# Patient Record
Sex: Female | Born: 1960 | Race: White | Hispanic: No | Marital: Married | Smoking: Current every day smoker
Health system: Southern US, Community
[De-identification: ages and names within clinical notes are randomized; demographics above are authoritative.]

## PROBLEM LIST (undated history)

## (undated) DIAGNOSIS — J432 Centrilobular emphysema: Secondary | ICD-10-CM

## (undated) DIAGNOSIS — B182 Chronic viral hepatitis C: Secondary | ICD-10-CM

## (undated) DIAGNOSIS — K219 Gastro-esophageal reflux disease without esophagitis: Secondary | ICD-10-CM

## (undated) DIAGNOSIS — E785 Hyperlipidemia, unspecified: Secondary | ICD-10-CM

## (undated) HISTORY — DX: Gastro-esophageal reflux disease without esophagitis: K21.9

## (undated) HISTORY — DX: Chronic viral hepatitis C: B18.2

## (undated) HISTORY — DX: Centrilobular emphysema: J43.2

## (undated) HISTORY — PX: VAGINAL HYSTERECTOMY: SUR661

## (undated) HISTORY — DX: Hyperlipidemia, unspecified: E78.5

---

## 2009-11-05 ENCOUNTER — Ambulatory Visit: Payer: Self-pay | Admitting: Family Medicine

## 2010-01-17 IMAGING — US ULTRASOUND RIGHT BREAST
1 series · 12 of 12 positions shown · non-contrast
Comparison: none

REASON FOR EXAM: Right pain nipple area
COMMENTS:

PROCEDURE:     US  - US BREAST RIGHT  - November 05, 2009  [DATE]
RESULT:       The patient received targeted ultrasound of the area of pain
in the right nipple and retroareolar region.  Targeted sonographic images
show no solid or cystic mass.

[Series 1: ultrasound right breast · 12 of 12 slices shown]
[im 1/12]
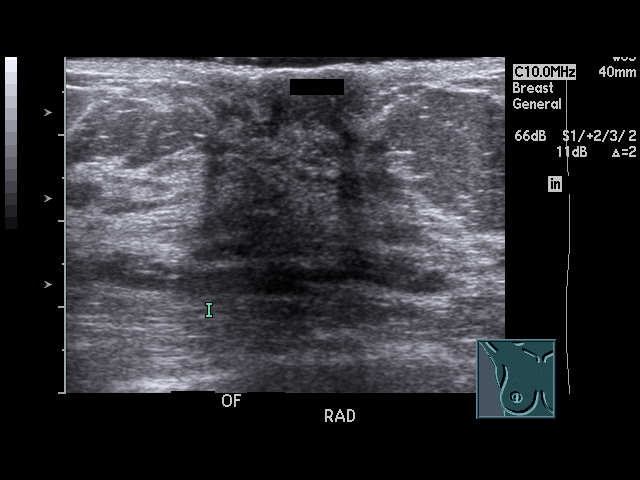
[im 2/12]
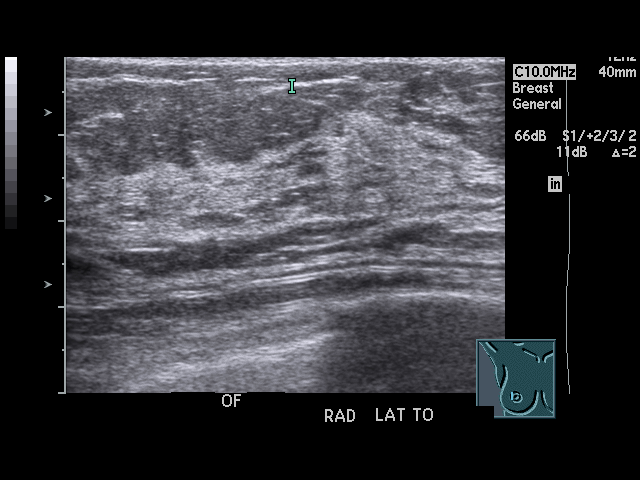
[im 3/12]
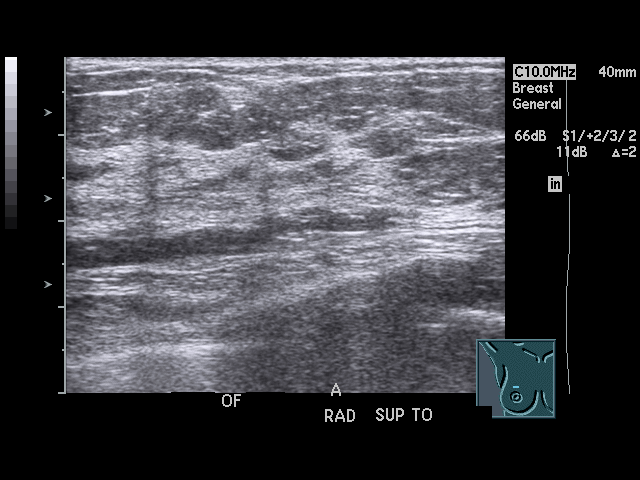
[im 4/12]
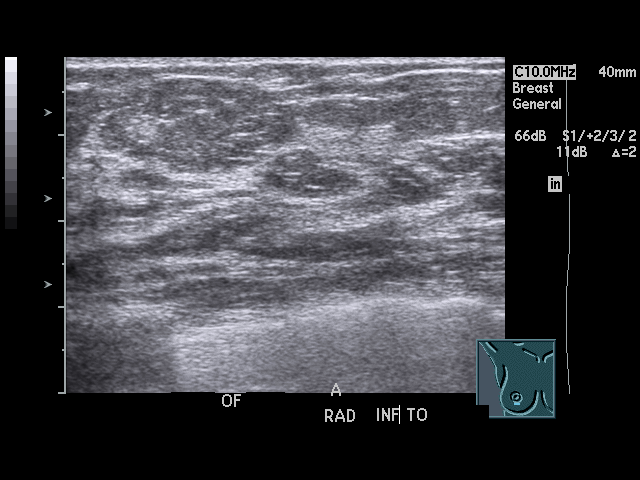
[im 5/12]
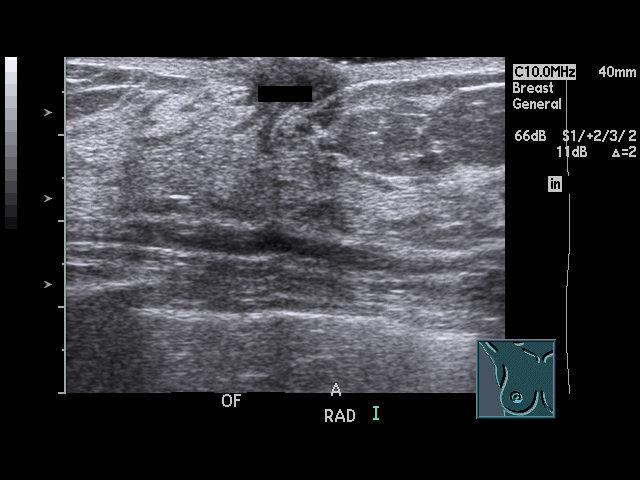
[im 6/12]
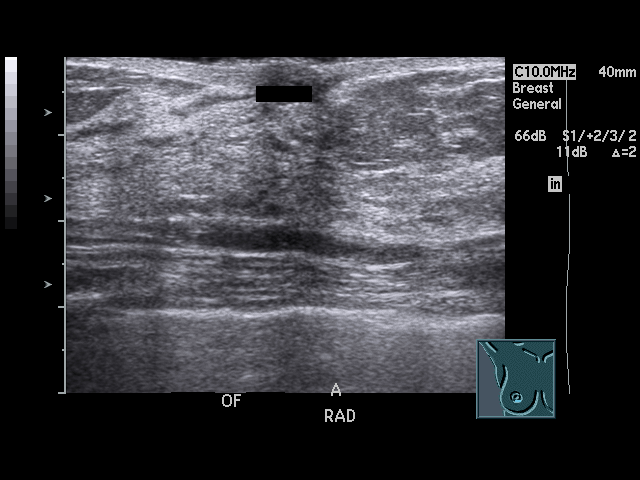
[im 7/12]
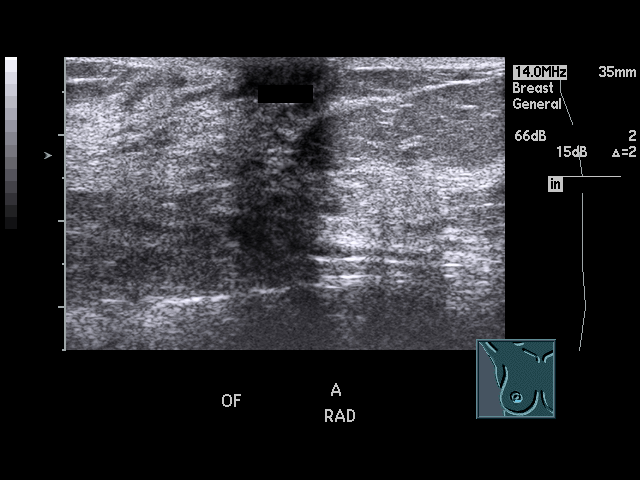
[im 8/12]
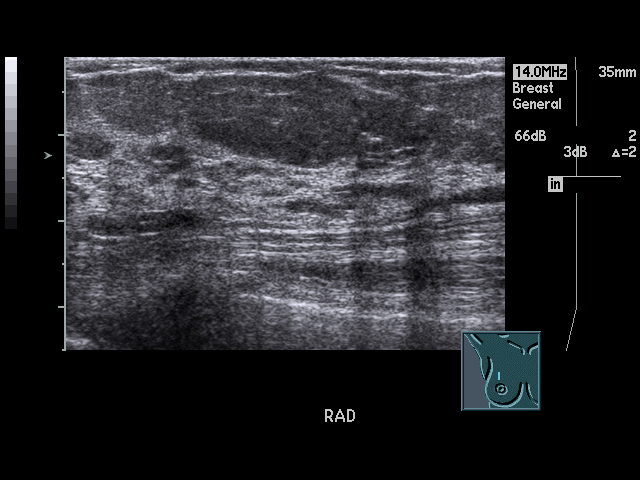
[im 9/12]
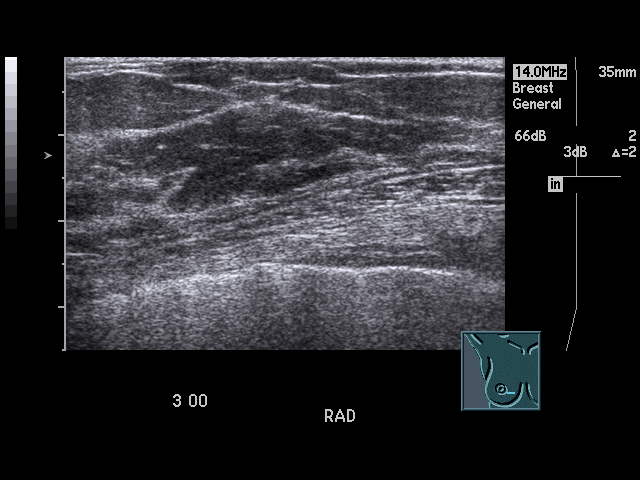
[im 10/12]
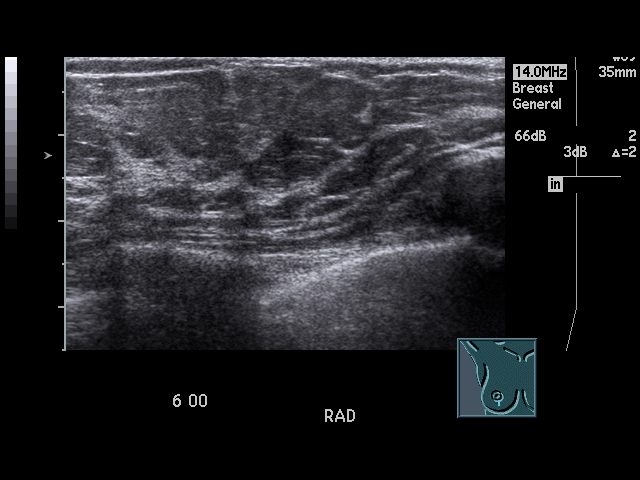
[im 11/12]
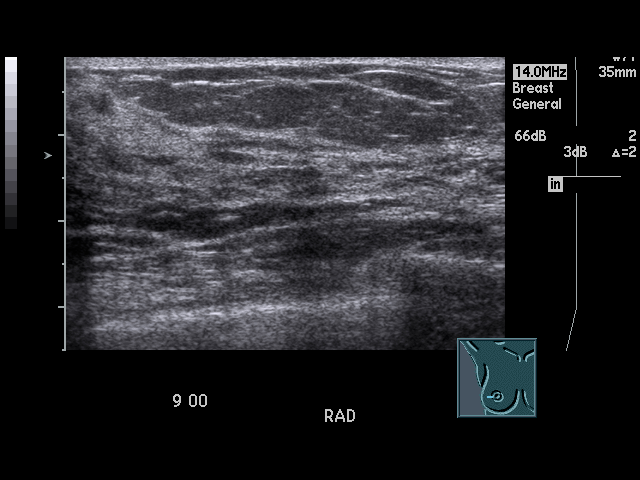
[im 12/12]
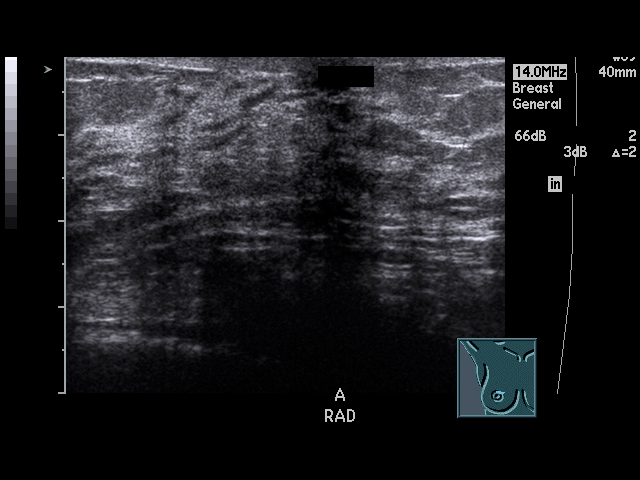

[12 of 12 positions shown; findings below may reference images not displayed]

IMPRESSION: Negative targeted ultrasound of the periareolar right breast region. The
lack of significant mammographic or sonographic findings should not prohibit
further investigation of a clinically worrisome or changing lesion.

## 2010-01-17 IMAGING — MG MAM DGTL DIAGNOSTIC MAMMO W/CAD
1 series · 8 of 8 positions shown · non-contrast
Comparison: none

REASON FOR EXAM: right pain nipple area  us if needed
COMMENTS:

[Series 83: R CC · right · 8 of 8 slices shown]
[im 1/8]
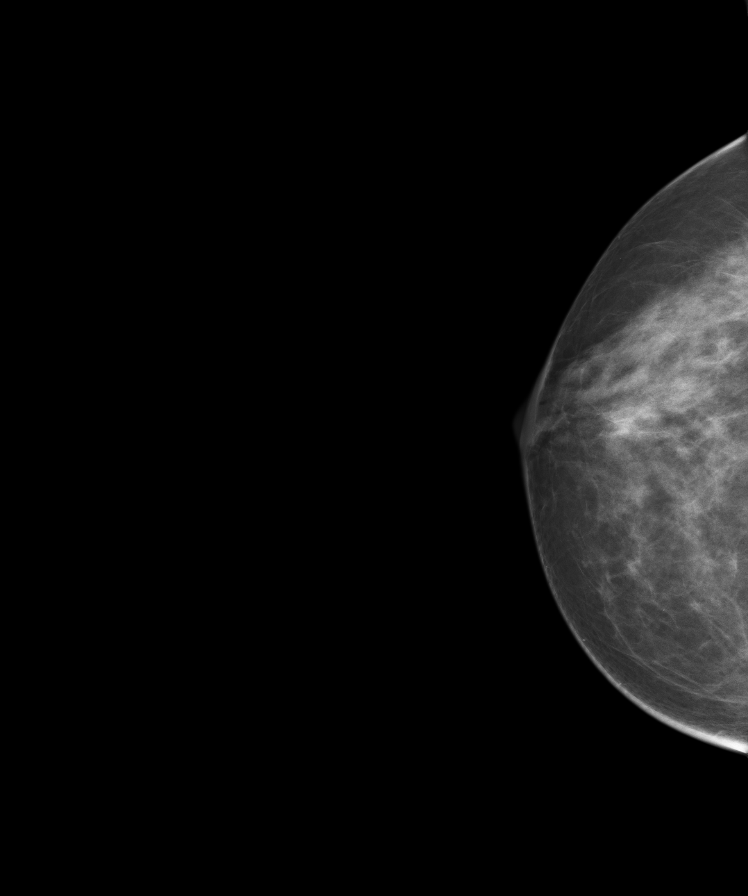
[im 2/8]
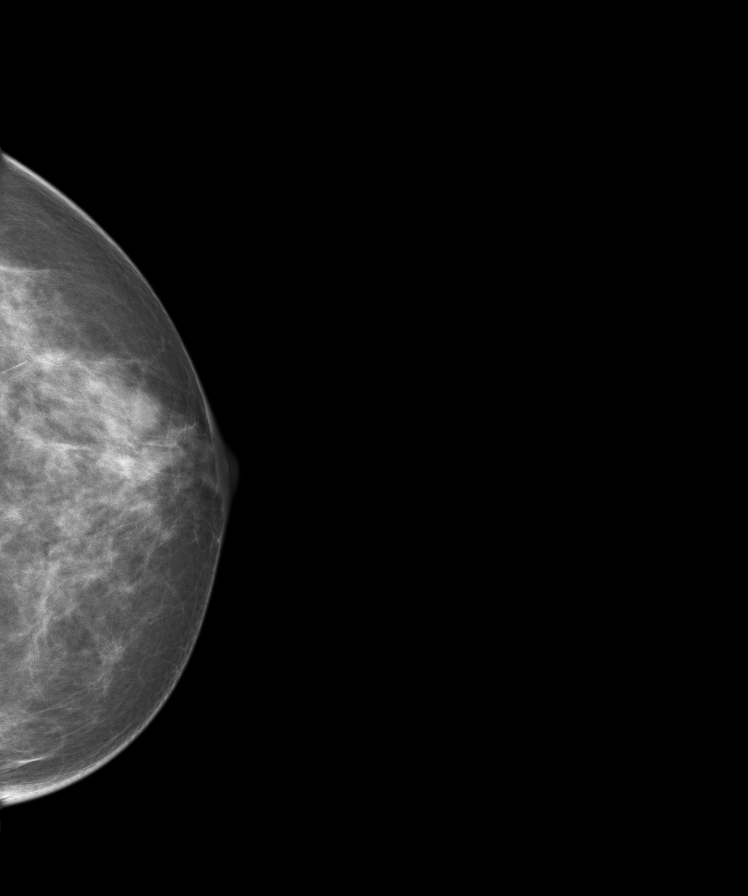
[im 3/8]
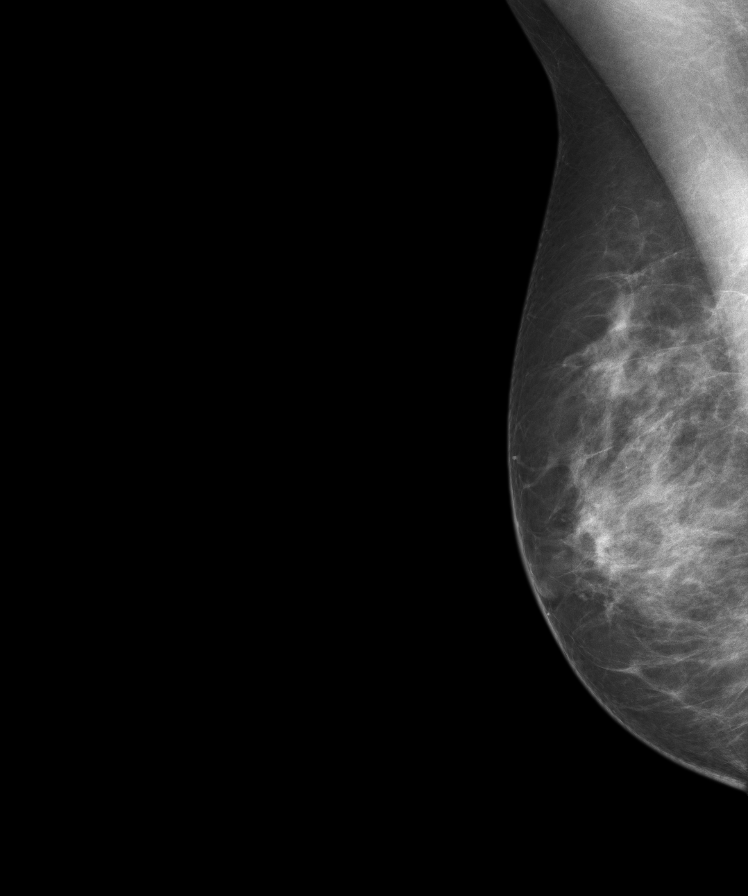
[im 4/8]
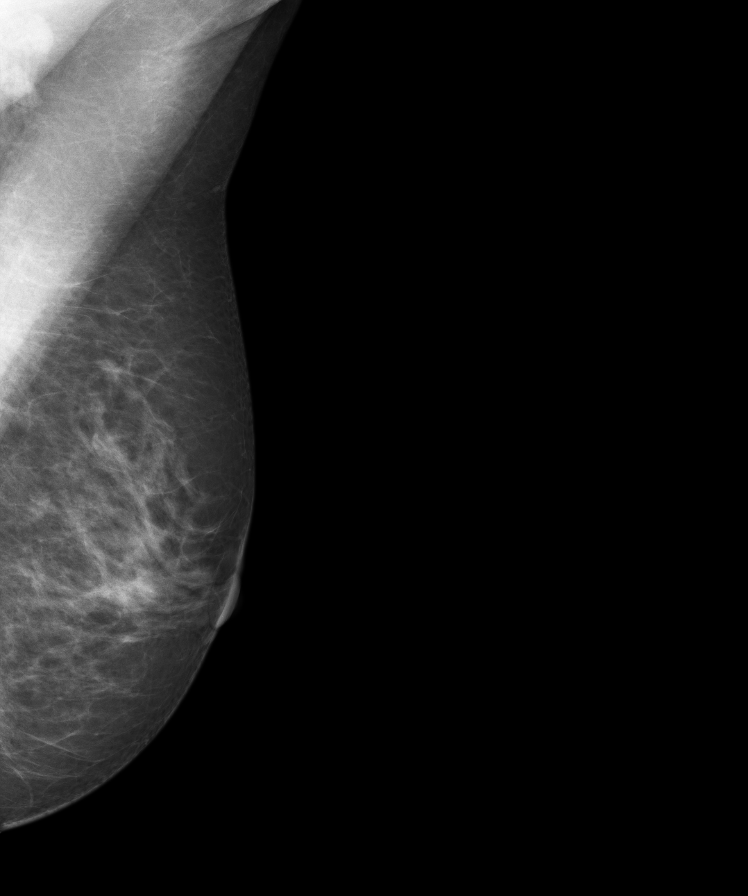
[im 5/8]
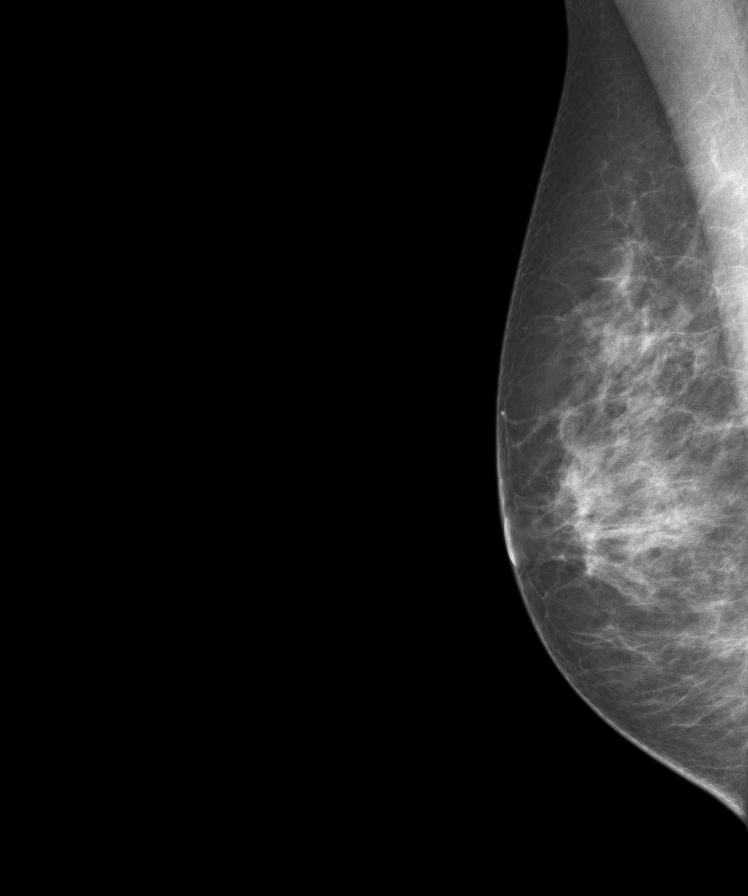
[im 6/8]
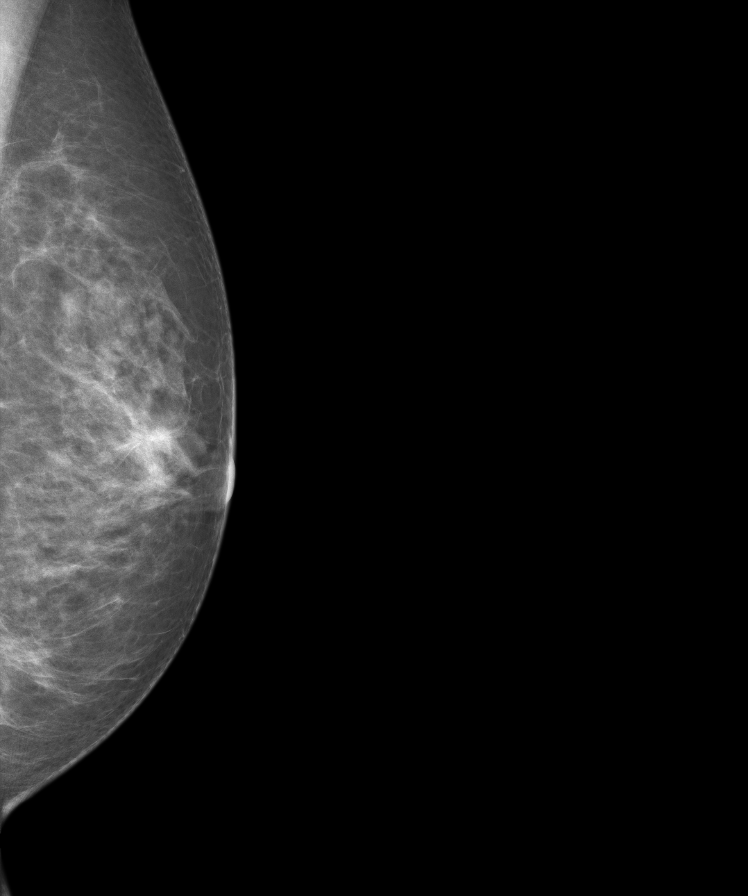
[im 7/8]
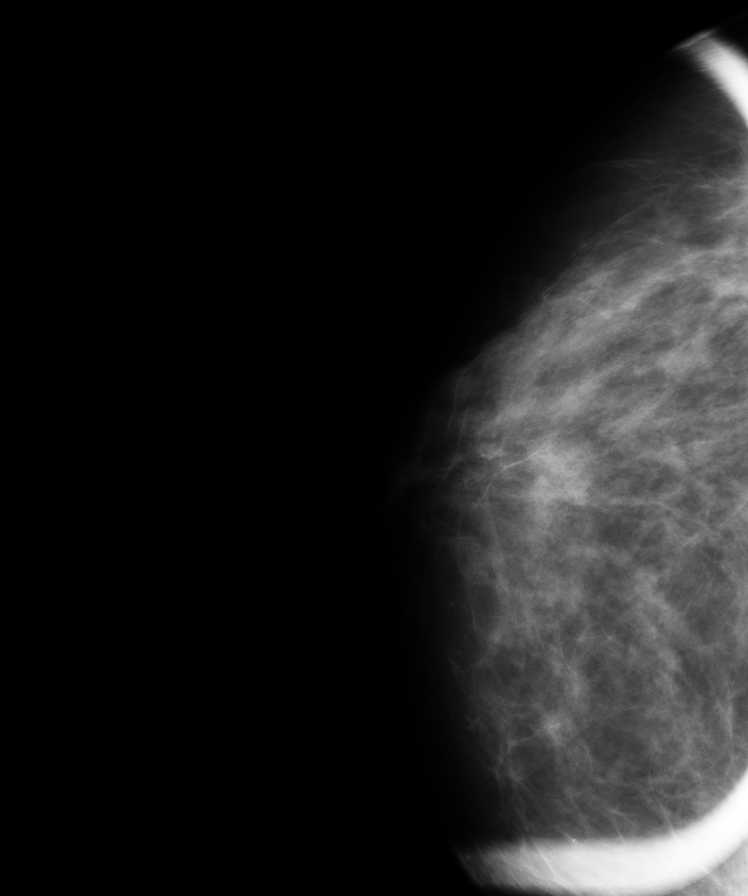
[im 8/8]
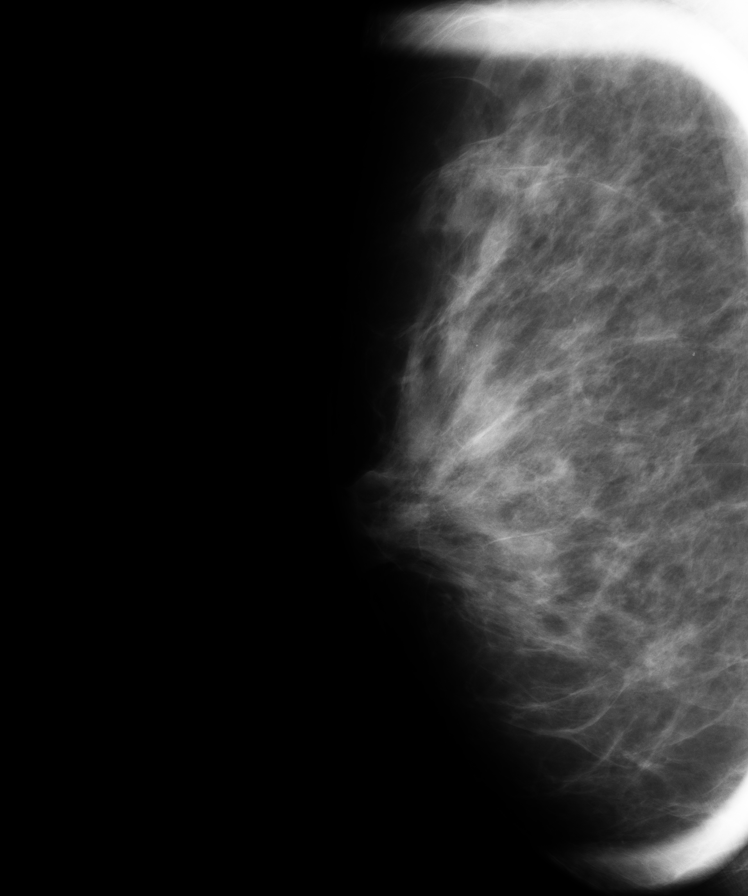

[8 of 8 positions shown; findings below may reference images not displayed]

PROCEDURE:     MAM - MAM DGTL DIAGNOSTIC MAMMO W/CAD  - November 05, 2009 [DATE]

RESULT:       This study is submitted on 11/09/09 for interpretation of
images obtained on 11/05/2009.  Outside images from [REDACTED]
dated 10/29/07 are submitted. Correlation is made to previous digital images
from [HOSPITAL] dated 12/03/02.

The breasts exhibit a moderately dense parenchymal pattern.  There is no
developing density, dominant mass or malignant-appearing calcification. The
appearance is stable. Targeted ultrasound of the periareolar region and
retroareolar region of the right breast was performed and showed no definite
solid or cystic mass.
IMPRESSION: 1.     Stable benign-appearing bilateral mammogram.
2.     BI-RADS:  Category 2- Benign Finding.
3.     Please continue to encourage appropriate annual mammographic follow
up and monthly breast self exam.

A negative mammogram report does not preclude biopsy or other evaluation of
a clinically palpable or otherwise suspicious mass or lesion. Breast cancer
may not be detected by mammography in up to 10% of cases.

## 2015-04-15 DIAGNOSIS — E7800 Pure hypercholesterolemia, unspecified: Secondary | ICD-10-CM | POA: Insufficient documentation

## 2015-04-15 DIAGNOSIS — J432 Centrilobular emphysema: Secondary | ICD-10-CM | POA: Insufficient documentation

## 2015-04-15 DIAGNOSIS — B1920 Unspecified viral hepatitis C without hepatic coma: Secondary | ICD-10-CM | POA: Insufficient documentation

## 2015-07-16 ENCOUNTER — Other Ambulatory Visit: Payer: Self-pay | Admitting: Family Medicine

## 2015-07-16 DIAGNOSIS — B182 Chronic viral hepatitis C: Secondary | ICD-10-CM

## 2015-07-28 ENCOUNTER — Ambulatory Visit
Admission: RE | Admit: 2015-07-28 | Discharge: 2015-07-28 | Disposition: A | Discharge status: Home / Self Care | Payer: No Typology Code available for payment source | Source: Ambulatory Visit | Attending: Family Medicine | Admitting: Family Medicine

## 2015-07-28 ENCOUNTER — Telehealth: Payer: Self-pay | Admitting: Gastroenterology

## 2015-07-28 DIAGNOSIS — I700 Atherosclerosis of aorta: Secondary | ICD-10-CM | POA: Insufficient documentation

## 2015-07-28 DIAGNOSIS — I714 Abdominal aortic aneurysm, without rupture: Secondary | ICD-10-CM | POA: Insufficient documentation

## 2015-07-28 DIAGNOSIS — K824 Cholesterolosis of gallbladder: Secondary | ICD-10-CM | POA: Insufficient documentation

## 2015-07-28 DIAGNOSIS — B182 Chronic viral hepatitis C: Secondary | ICD-10-CM | POA: Diagnosis not present

## 2015-07-28 NOTE — Telephone Encounter (Signed)
Left voice message for patient to call and schedule with Dr. Servando Snare for GERD, chronic constipation, and Hep C

## 2015-08-12 ENCOUNTER — Other Ambulatory Visit: Payer: Self-pay

## 2015-08-24 ENCOUNTER — Ambulatory Visit (INDEPENDENT_AMBULATORY_CARE_PROVIDER_SITE_OTHER): Payer: No Typology Code available for payment source | Admitting: Gastroenterology

## 2015-08-24 ENCOUNTER — Encounter (INDEPENDENT_AMBULATORY_CARE_PROVIDER_SITE_OTHER): Payer: Self-pay

## 2015-08-24 VITALS — BP 115/69 | HR 74 | Temp 98.2°F | Ht 67.0 in | Wt 137.0 lb

## 2015-08-24 DIAGNOSIS — B1920 Unspecified viral hepatitis C without hepatic coma: Secondary | ICD-10-CM

## 2015-08-24 NOTE — Progress Notes (Signed)
Gastroenterology Consultation  Referring Provider:     Marina Goodell, MD Primary Care Physician:  [facility], Nina Guthrie, MD Primary Gastroenterologist:  Dr. Servando Snare     Reason for Consultation:     Hepatitis C        HPI:   Nina Cunningham is a 54 y.o. y/o female referred for consultation & management of hepatitis C by Dr. Maryjane Hurter, Nina Guthrie, MD.  This patient comes today after being sent by her primary care provider for hepatitis C. The patient reports that she knew she had an infection of her liver since 1988 but at that time they did not know his hepatitis C. The patient then reports that she had used drugs back in 1988 and was drinking heavily but has not done either of those since then. The patient has been in her normal state of health and reports never being treated for her hepatitis C in the past. She denies any nausea vomiting fevers chills or abdominal pain. She reports that she is married to the same man for over 20 years but has never had him checked for hepatitis C. There is no report of any jaundice black stools or bloody stools. The patient recently had an ultrasound of the right upper quadrant that did not show any signs of cirrhosis but did show a 7 mm polyp which a repeat ultrasound was recommended in 3 years.  Past Medical History  Diagnosis Date  . Centrilobular emphysema   . Hyperlipidemia   . Chronic hepatitis C without hepatic coma   . Gastro-esophageal reflux     Past Surgical History  Procedure Laterality Date  . Vaginal hysterectomy    . Cesarean section      Prior to Admission medications   Medication Sig Start Date End Date Taking? Authorizing Provider  omeprazole (PRILOSEC) 20 MG capsule Take by mouth. 04/15/15   Historical Provider, MD  varenicline (CHANTIX PAK) 0.5 MG X 11 & 1 MG X 42 tablet Follow package directions. 04/15/15   Historical Provider, MD    Family History  Problem Relation Age of Onset  . Stroke Father      Social History  Substance  Use Topics  . Smoking status: Current Every Day Smoker  . Smokeless tobacco: Never Used  . Alcohol Use: No    Allergies as of 08/24/2015  . (No Known Allergies)    Review of Systems:    All systems reviewed and negative except where noted in HPI.   Physical Exam:  BP 115/69 mmHg  Pulse 74  Temp(Src) 98.2 F (36.8 C)  Ht 5\' 7"  (1.702 m)  Wt 137 lb (62.143 kg)  BMI 21.45 kg/m2 No LMP recorded. Psych:  Alert and cooperative. Normal mood and affect. General:   Alert,  Well-developed, well-nourished, pleasant and cooperative in NAD Head:  Normocephalic and atraumatic. Eyes:  Sclera clear, no icterus.   Conjunctiva pink. Ears:  Normal auditory acuity. Nose:  No deformity, discharge, or lesions. Mouth:  No deformity or lesions,oropharynx pink & moist. Neck:  Supple; no masses or thyromegaly. Lungs:  Respirations even and unlabored.  Clear throughout to auscultation.   No wheezes, crackles, or rhonchi. No acute distress. Heart:  Regular rate and rhythm; no murmurs, clicks, rubs, or gallops. Abdomen:  Normal bowel sounds.  No bruits.  Soft, non-tender and non-distended without masses, hepatosplenomegaly or hernias noted.  No guarding or rebound tenderness.  Negative Carnett sign.   Rectal:  Deferred.  Msk:  Symmetrical without  gross deformities.  Good, equal movement & strength bilaterally. Pulses:  Normal pulses noted. Extremities:  No clubbing or edema.  No cyanosis. Neurologic:  Alert and oriented x3;  grossly normal neurologically. Skin:  Intact without significant lesions or rashes.  No jaundice. Lymph Nodes:  No significant cervical adenopathy. Psych:  Alert and cooperative. Normal mood and affect.  Imaging Studies: US Abdomen Complete  07/28/2015   CLINICAL DATA:  Chronic hepatitis-C  EXAM: ULTRASOUND ABDOMEN COMPLETE  COMPARISON:  None.  FINDINGS: Gallbladder: Hypoechoic non shadowing foci along the posterior gallbladder wall measuring up 7 mm compatible with small polyps.  Normal wall thickness measuring 1.7 mm. No Murphys sign.  Common bile duct: Diameter: 4.7 mm  Liver: No focal lesion identified. Within normal limits in parenchymal echogenicity.  IVC: No abnormality visualized.  Pancreas: Visualized portion unremarkable.  Spleen: Size and appearance within normal limits.  Right Kidney: Length: 11.3 cm. Echogenicity within normal limits. No mass or hydronephrosis visualized.  Left Kidney: Length: 11.6 cm. Echogenicity within normal limits. No mass or hydronephrosis visualized.  Abdominal aorta: Atherosclerotic changes of the infrarenal aorta with mild aneurysmal dilatation measuring 2.3 x 3.1 cm.  Other findings: None.  IMPRESSION: Gallbladder polyps.  No acute or abnormal hepatic finding  No biliary dilatation  Aneurysmal atherosclerotic abdominal aorta, maximal diameter 3 cm  Recommend followup by ultrasound in 3 years. This recommendation follows ACR consensus guidelines: White Paper of the ACR Incidental Findings Committee II on Vascular Findings. [place] Coll Radiol 2013; [phone]   Electronically Signed   By: Judie Petit.  Shick M.D.   On: 07/28/2015 09:39    Assessment and Plan:   DYONNA Cunningham is a 54 y.o. y/o female who comes in today with a history of hepatitis C since the late 80s. The patient has never been treated for hepatitis C. The patient will have her labs sent off for other causes of abnormal liver enzymes. The patient will also have her liver checked for fibrosis with a fibrosis scan. Pending the results of these and the genotype of her hepatitis C will dictate her treatment. He should has been explained the plan and agrees with it.   Note: This dictation was prepared with Dragon dictation along with smaller phrase technology. Any transcriptional errors that result from this process are unintentional.

## 2015-08-26 ENCOUNTER — Other Ambulatory Visit: Payer: Self-pay

## 2015-09-04 ENCOUNTER — Ambulatory Visit
Admission: RE | Admit: 2015-09-04 | Discharge: 2015-09-04 | Disposition: A | Discharge status: Home / Self Care | Payer: No Typology Code available for payment source | Source: Ambulatory Visit | Attending: Gastroenterology | Admitting: Gastroenterology

## 2015-09-04 DIAGNOSIS — B1920 Unspecified viral hepatitis C without hepatic coma: Secondary | ICD-10-CM | POA: Insufficient documentation

## 2015-09-07 ENCOUNTER — Telehealth: Payer: Self-pay

## 2015-09-07 NOTE — Telephone Encounter (Signed)
Pt notified of [place] results. Treatment form has been completed and faxed to [employer]. Awaiting approval.

## 2015-09-07 NOTE — Telephone Encounter (Signed)
-----   Message from Midge Minium, MD sent at 09/07/2015  8:32 AM EDT ----- Let the patient know the score of of her liver fibrosis was 2-3 out of 4.

## 2015-09-08 ENCOUNTER — Telehealth: Payer: Self-pay | Admitting: Gastroenterology

## 2015-09-08 NOTE — Telephone Encounter (Signed)
Patient called and asked for a time to come talk to Dr.Wohl before starting treatment, Wasn't sure where i could put her in, Please call patient and let her know

## 2015-09-10 ENCOUNTER — Other Ambulatory Visit: Payer: Self-pay

## 2015-09-10 DIAGNOSIS — B182 Chronic viral hepatitis C: Secondary | ICD-10-CM

## 2015-09-10 MED ORDER — SOFOSBUVIR-VELPATASVIR 400-100 MG PO TABS: 1.0000 | ORAL_TABLET | Freq: Every day | ORAL | Status: DC

## 2015-09-10 NOTE — Telephone Encounter (Signed)
Returned pt's call regarding treatment. Pt only wanted to let me know she received a call from [facility] regarding her medication. Answered several questions about her treatment. Pt verbalized an understanding and will wait to hear back from me regarding her approval. New rx was faxed to [facility] today per their request.

## 2015-09-16 ENCOUNTER — Telehealth: Payer: Self-pay | Admitting: Gastroenterology

## 2015-09-16 NOTE — Telephone Encounter (Signed)
Patient said she checked with the pharmacy and nothing has been called in yet. [phone]

## 2015-09-17 NOTE — Telephone Encounter (Signed)
LVM letting pt know her Hep C medication would not come from her local pharmacy. Advised pt to call me back to discuss approval and receipt of letter. She will need to contact the specialty pharmacy to schedule delivery.

## 2015-09-23 NOTE — Telephone Encounter (Signed)
LVM for pt to return my call. Advised the specialty pharmacy will be calling her soon to schedule delivery.

## 2015-10-12 ENCOUNTER — Telehealth: Payer: Self-pay | Admitting: Gastroenterology

## 2015-10-12 NOTE — Telephone Encounter (Signed)
She wants to know have you heard anything from her pharmacy?

## 2015-10-12 NOTE — Telephone Encounter (Signed)
Called pt to inform her I received a prior authorization this morning to fill out for pt's Hep C medication. I have faxed all required documents to [facility]. Waiting for approval.

## 2015-10-19 ENCOUNTER — Telehealth: Payer: Self-pay | Admitting: Gastroenterology

## 2015-10-19 NOTE — Telephone Encounter (Signed)
Patient called to let you know that she still hasn't heard from her insurance company.

## 2015-10-19 NOTE — Telephone Encounter (Signed)
Called pt's Nurse, children'sinsurance company [employer] [facility]. Currently Hep C medication Dorita Fraypclusa is in process. Once this has been completed they will contact pt to schedule delivery. I have informed pt of this and gave her the number to the pharmacy to check as well.

## 2015-10-29 ENCOUNTER — Telehealth: Payer: Self-pay | Admitting: Gastroenterology

## 2015-10-29 NOTE — Telephone Encounter (Signed)
Call patient regarding medication AUTH, Per patient she got something in the mail saying she was approved,but pharmacy doesn't know anything or patients insurance

## 2015-10-30 NOTE — Telephone Encounter (Signed)
Spoke with pt this morning regarding her medication. Her copay is $1300.00. I have emailed Brandy with [employer] to request a patient assistance form. Advised pt I will call her when this come in.

## 2015-11-03 ENCOUNTER — Other Ambulatory Visit: Payer: Self-pay

## 2015-11-03 DIAGNOSIS — B182 Chronic viral hepatitis C: Secondary | ICD-10-CM

## 2015-11-03 MED ORDER — SOFOSBUVIR-VELPATASVIR 400-100 MG PO TABS: 1.0000 | ORAL_TABLET | Freq: Every day | ORAL | Status: DC

## 2015-11-16 IMAGING — US US LIVER ELASTOGRAPHY
1 series · 11 of 11 positions shown · non-contrast
Comparison: None.

CLINICAL DATA: Hepatitis-C.

EXAM:
ULTRASOUND HEPATIC ELASTOGRAPHY
TECHNIQUE: Ultrasound elastography evaluation of the liver was performed. A
region of interest was placed in the right lobe of the liver.
Following application of a compressive sonographic pulse, shear
waves were detected in the adjacent hepatic tissue and the shear
wave velocity was calculated. Multiple assessments were performed at
the selected site. Median shear wave velocity is correlated to a
Metavir fibrosis score.

[Series 1: us liver elastography · 0.20mm/px · 11 of 11 slices shown]
[im 1/11]
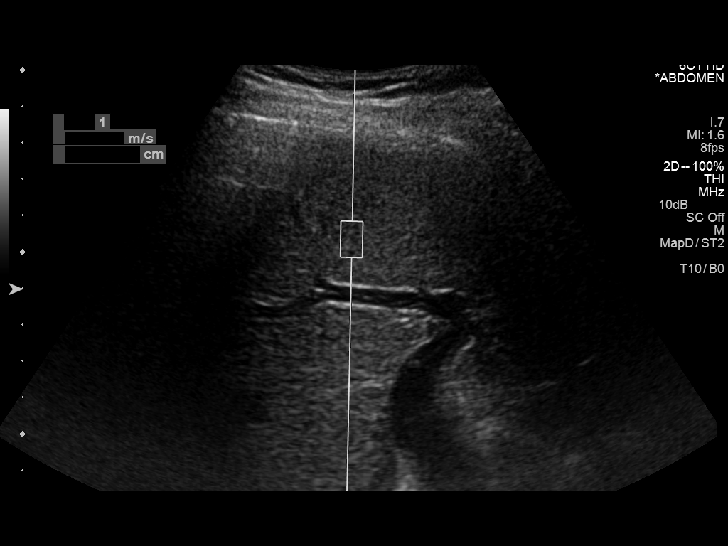
[im 2/11]
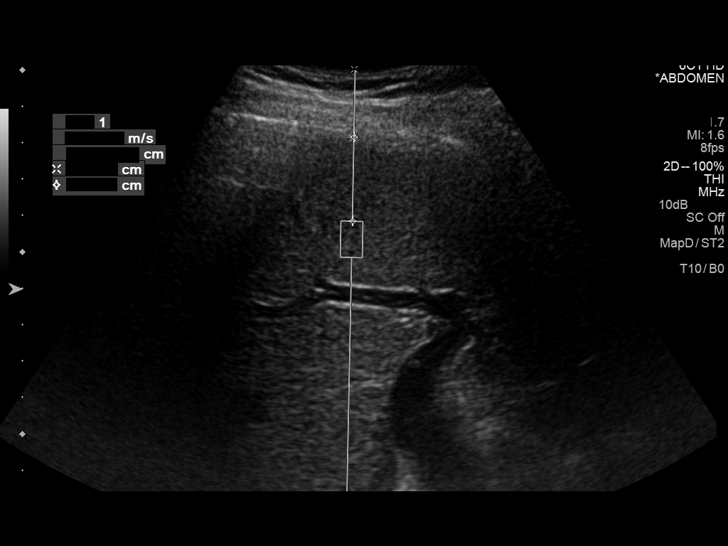
[im 3/11]
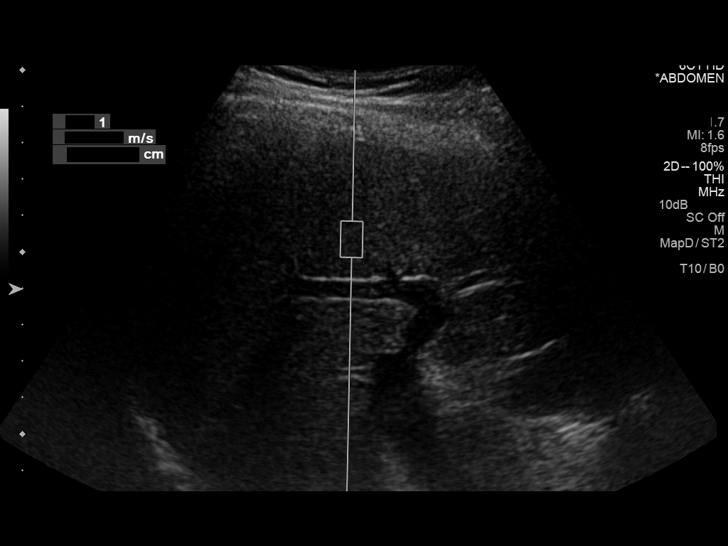
[im 4/11]
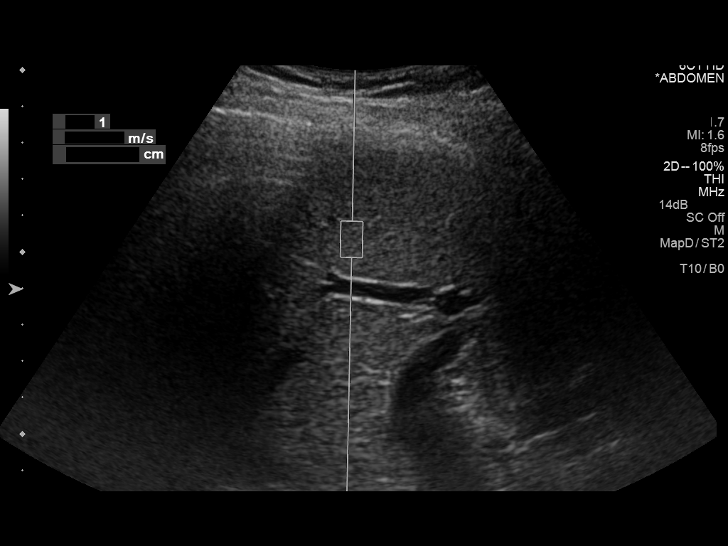
[im 5/11]
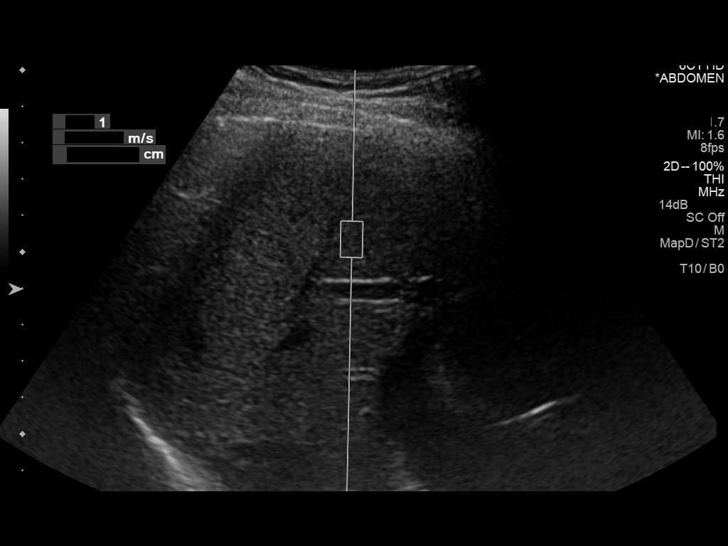
[im 6/11]
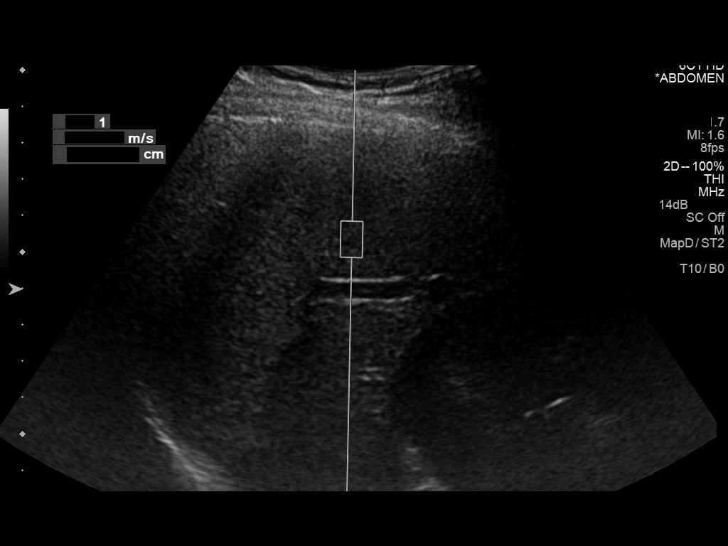
[im 7/11]
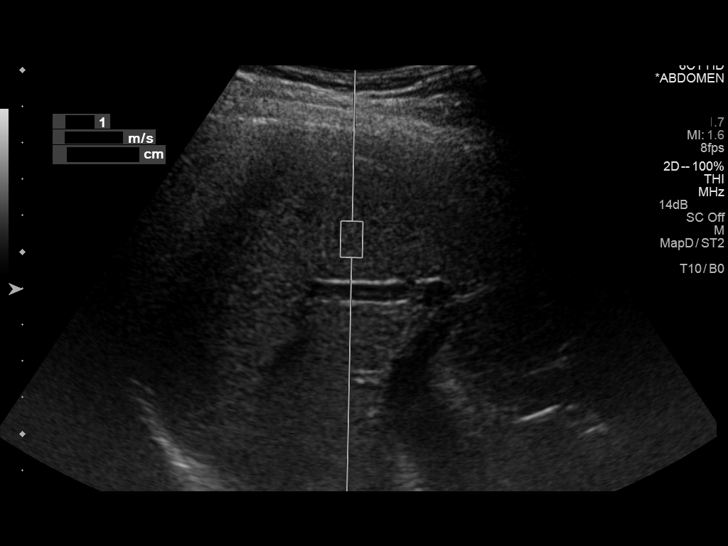
[im 8/11]
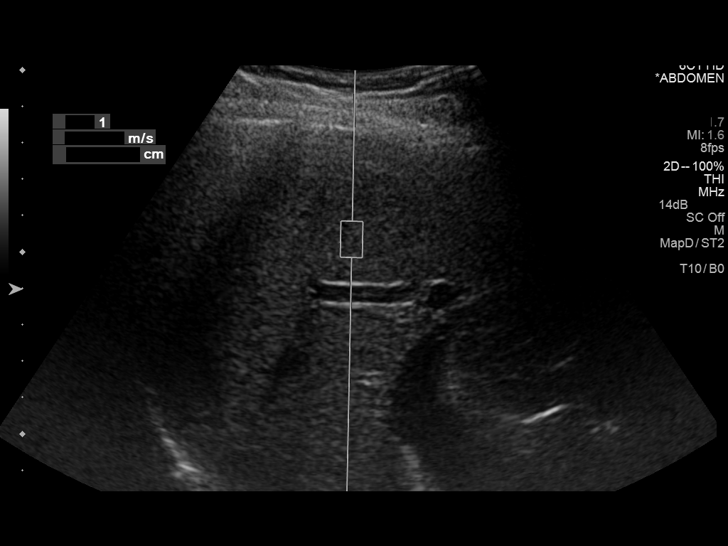
[im 9/11]
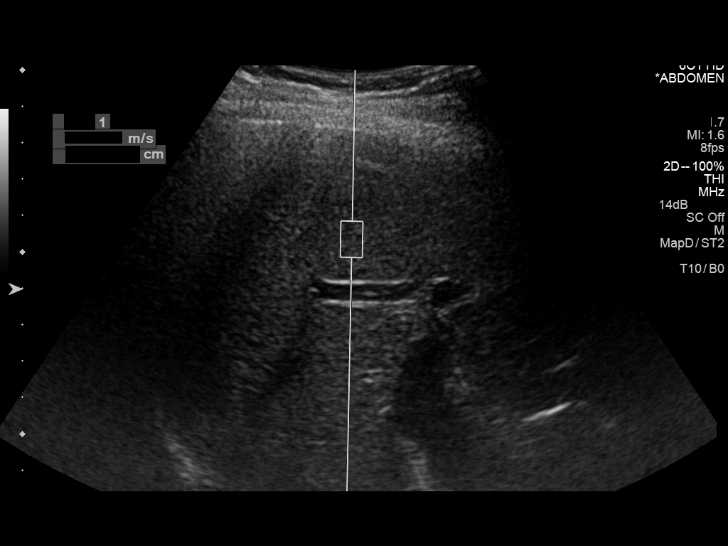
[im 10/11]
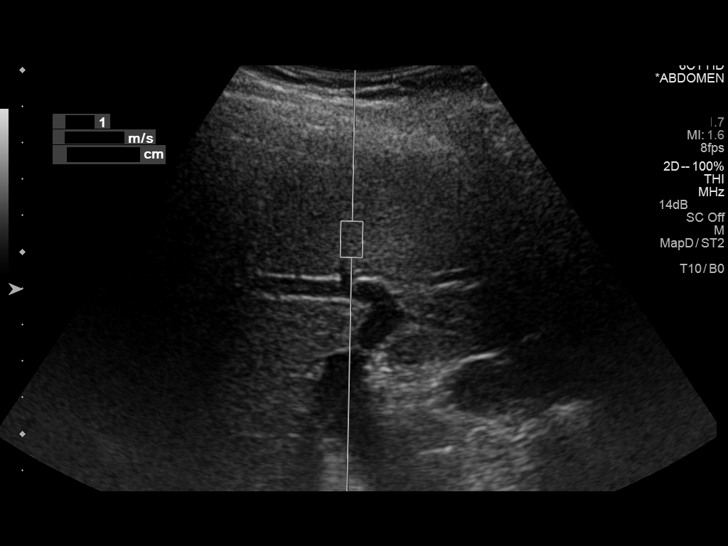
[im 11/11]
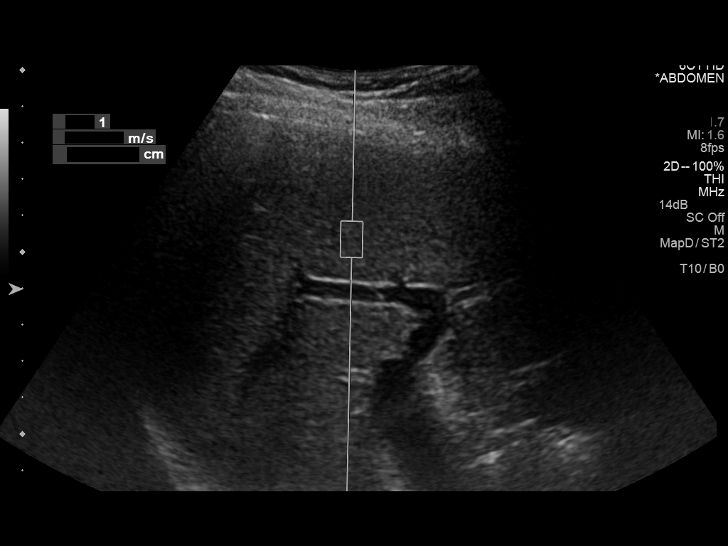

[11 of 11 positions shown; findings below may reference images not displayed]

FINDINGS: Device: Siemens Helix VTQ

Transducer 6 C1

Patient position: Oblique

Number of measurements: 10

Hepatic segment:  8

Median velocity:   1.23  m/sec

IQR:

IQR/Median velocity ratio:

Corresponding Metavir fibrosis score:  F2 and some F3

Risk of fibrosis: Moderate

Limitations of exam: None

Pertinent findings noted on other imaging exams:  None

Please note that abnormal shear wave velocities may also be
identified in clinical settings other than with hepatic fibrosis,
such as: acute hepatitis, elevated right heart and central venous
pressures including use of beta blockers, Colan [place] disease
(Joshjax), infiltrative processes such as
mastocytosis/amyloidosis/infiltrative tumor, extrahepatic
cholestasis, in the post-prandial state, and liver transplantation.
Correlation with patient history, laboratory data, and clinical
condition recommended.
IMPRESSION: Median hepatic shear wave velocity is calculated at 1.23 m/sec.

Corresponding Metavir fibrosis score is  F2 and some F3.

Risk of fibrosis is moderate.

Follow-up: Additional testing is appropriate.

## 2016-02-23 ENCOUNTER — Telehealth: Payer: Self-pay | Admitting: Gastroenterology

## 2016-02-23 NOTE — Telephone Encounter (Signed)
She would like to talk to you about her insurance denying medication

## 2016-02-24 NOTE — Telephone Encounter (Signed)
Patient called back and would like a copy of the denial letter.

## 2016-02-24 NOTE — Telephone Encounter (Signed)
Mailed [employer] denial letter to patient

## 2016-08-03 ENCOUNTER — Other Ambulatory Visit: Payer: Self-pay

## 2016-08-03 DIAGNOSIS — B182 Chronic viral hepatitis C: Secondary | ICD-10-CM

## 2016-08-09 LAB — HEPATIC FUNCTION PANEL
ALT: 35 IU/L — AB (ref 0–32)
AST: 32 IU/L (ref 0–40)
Albumin: 4.7 g/dL (ref 3.5–5.5)
Alkaline Phosphatase: 101 IU/L (ref 39–117)
BILIRUBIN TOTAL: 0.4 mg/dL (ref 0.0–1.2)
BILIRUBIN, DIRECT: 0.13 mg/dL (ref 0.00–0.40)
TOTAL PROTEIN: 7.7 g/dL (ref 6.0–8.5)

## 2016-08-09 LAB — HCV RT-PCR, QUANT (NON-GRAPH)

## 2016-08-16 ENCOUNTER — Telehealth: Payer: Self-pay

## 2016-08-16 NOTE — Telephone Encounter (Signed)
-----   Message from Midge Miniumarren Wohl, MD sent at 08/14/2016 11:55 AM EDT ----- Let the patient know that her viral level is so high it is unable to be calculated and she should be treated for her hepatitis C.

## 2016-08-16 NOTE — Telephone Encounter (Signed)
Pt notified of results and advised paperwork has been completed and faxed to [employer].

## 2016-08-19 ENCOUNTER — Telehealth: Payer: Self-pay | Admitting: Gastroenterology

## 2016-08-19 NOTE — Telephone Encounter (Signed)
Patient called and would like to speak with you.

## 2016-08-25 NOTE — Telephone Encounter (Signed)
Left vm for pt to return my call.  

## 2016-08-31 ENCOUNTER — Telehealth: Payer: Self-pay | Admitting: Gastroenterology

## 2016-08-31 NOTE — Telephone Encounter (Signed)
Pt currently has 2 boxes of her Hep C medication and will go ahead and start taking. Pt doesn't want me to resubmit for another Hep C drug. Pt will call me once she completes the 8 weeks for labs.

## 2016-08-31 NOTE — Telephone Encounter (Signed)
See other note regarding medication.

## 2016-08-31 NOTE — Telephone Encounter (Signed)
Patient would like for you to call her regarding the medication that her insurance denied.

## 2016-10-20 ENCOUNTER — Telehealth: Payer: Self-pay | Admitting: Gastroenterology

## 2016-10-20 NOTE — Telephone Encounter (Signed)
Patient would like to talk to you

## 2016-10-21 NOTE — Telephone Encounter (Signed)
Pt wanted to inform me she would be done with her treatment on Tuesday, Oct 31st. She will need a 2 week post treatment appt. Scheduled for Nov 15th.

## 2016-11-09 ENCOUNTER — Ambulatory Visit (INDEPENDENT_AMBULATORY_CARE_PROVIDER_SITE_OTHER): Payer: 59 | Admitting: Gastroenterology

## 2016-11-09 ENCOUNTER — Other Ambulatory Visit
Admission: RE | Admit: 2016-11-09 | Discharge: 2016-11-09 | Disposition: A | Discharge status: Home / Self Care | Payer: Managed Care, Other (non HMO) | Source: Ambulatory Visit | Attending: Gastroenterology | Admitting: Gastroenterology

## 2016-11-09 ENCOUNTER — Encounter: Payer: Self-pay | Admitting: Gastroenterology

## 2016-11-09 ENCOUNTER — Other Ambulatory Visit: Payer: Self-pay

## 2016-11-09 VITALS — BP 127/73 | HR 72 | Temp 97.7°F | Ht 67.0 in | Wt 145.0 lb

## 2016-11-09 DIAGNOSIS — B182 Chronic viral hepatitis C: Secondary | ICD-10-CM

## 2016-11-09 LAB — HEPATIC FUNCTION PANEL
ALK PHOS: 71 U/L (ref 38–126)
ALT: 27 U/L (ref 14–54)
AST: 28 U/L (ref 15–41)
Albumin: 4.6 g/dL (ref 3.5–5.0)
BILIRUBIN DIRECT: 0.1 mg/dL (ref 0.1–0.5)
BILIRUBIN INDIRECT: 0.3 mg/dL (ref 0.3–0.9)
Total Bilirubin: 0.4 mg/dL (ref 0.3–1.2)
Total Protein: 8 g/dL (ref 6.5–8.1)

## 2016-11-09 NOTE — Progress Notes (Signed)
   [facility]  Primary Gastroenterologist:  Dr. Midge Miniumarren Katja Cunningham  Chief Complaint  Patient presents with  . Hepatitis C    Treatment completed    HPI: Nina Cunningham is a 55 y.o. female here for follow-up after being treated for hepatitis C. The patient reports that she does not feel any different than she did prior to treatment but she was having very little symptoms before. The patient does report that she started having some bladder pain and some back spasms probably 2 weeks into the treatment but have not resolved following the completion of the treatment 2 weeks ago.  Current Outpatient Prescriptions  Medication Sig Dispense Refill  . omeprazole (PRILOSEC) 20 MG capsule Take by mouth.    . varenicline (CHANTIX PAK) 0.5 MG X 11 & 1 MG X 42 tablet Follow package directions.     No current facility-administered medications for this visit.     Allergies as of 11/09/2016  . (No Known Allergies)    ROS:  General: Negative for anorexia, weight loss, fever, chills, fatigue, weakness. ENT: Negative for hoarseness, difficulty swallowing , nasal congestion. CV: Negative for chest pain, angina, palpitations, dyspnea on exertion, peripheral edema.  Respiratory: Negative for dyspnea at rest, dyspnea on exertion, cough, sputum, wheezing.  GI: See history of present illness. GU:  Negative for dysuria, hematuria, urinary incontinence, urinary frequency, nocturnal urination.  Endo: Negative for unusual weight change.    Physical Examination:   BP 127/73   Pulse 72   Temp 97.7 F (36.5 C) (Oral)   Ht 5\' 7"  (1.702 m)   Wt 145 lb (65.8 kg)   BMI 22.71 kg/m   General: Well-nourished, well-developed in no acute distress.  Eyes: No icterus. Conjunctivae pink. Neuro: Alert and oriented x 3.  Grossly intact. Skin: Warm and dry, no jaundice.   Psych: Alert and cooperative, normal mood and affect.  Labs:    Imaging Studies: No results found.  Assessment  and Plan:   Nina Cunningham is a 55 y.o. y/o female comes in today after completing treatment for her hepatitis C. The patient completed the entire 8 weeks of her treatment. The patient has been given labs to have her hepatitis C and liver enzymes checked. The patient will also be checked in 6 months and a year. The patient has been told to contact me if she has any further GI problems.    Nina Miniumarren Chayanne Filippi, MD. Clementeen GrahamFACG   Note: This dictation was prepared with Dragon dictation along with smaller phrase technology. Any transcriptional errors that result from this process are unintentional.

## 2016-11-14 ENCOUNTER — Telehealth: Payer: Self-pay

## 2016-11-14 NOTE — Telephone Encounter (Signed)
-----   Message from Midge Miniumarren Wohl, MD sent at 11/14/2016 11:57 AM EST ----- The patient know that her liver enzymes have, down to normal. We are waiting for her virus level to come back to make sure it is negative.

## 2016-11-14 NOTE — Telephone Encounter (Signed)
Pt notified of lab result. 

## 2016-12-02 ENCOUNTER — Telehealth: Payer: Self-pay

## 2016-12-02 LAB — HCV RT-PCR, QUANT (NON-GRAPH)
HCV LOG10: UNDETERMINED {Log_copies}/mL
IU LOG10: UNDETERMINED {Log_IU}/mL

## 2016-12-02 NOTE — Telephone Encounter (Signed)
-----   Message from Midge Miniumarren Wohl, MD sent at 12/02/2016 11:19 AM EST ----- Let the patient know that the HCV was negative.

## 2016-12-02 NOTE — Telephone Encounter (Signed)
Pt notified of lab results. Post dated message has been done to contact pt in June for her repeat viral load.

## 2017-05-31 ENCOUNTER — Telehealth: Payer: Self-pay | Admitting: Gastroenterology

## 2017-05-31 ENCOUNTER — Other Ambulatory Visit: Payer: Self-pay

## 2017-05-31 DIAGNOSIS — B182 Chronic viral hepatitis C: Secondary | ICD-10-CM

## 2017-05-31 NOTE — Telephone Encounter (Signed)
Orders for LFT's and viral load has been added. Pt notified these are ready for her to go to [facility].

## 2017-05-31 NOTE — Telephone Encounter (Signed)
Patient called and stated that she is due for a blood test this month. Please call and set it up with her.

## 2017-06-06 ENCOUNTER — Other Ambulatory Visit
Admission: RE | Admit: 2017-06-06 | Discharge: 2017-06-06 | Disposition: A | Discharge status: Home / Self Care | Payer: No Typology Code available for payment source | Source: Ambulatory Visit | Attending: Gastroenterology | Admitting: Gastroenterology

## 2017-06-06 DIAGNOSIS — B182 Chronic viral hepatitis C: Secondary | ICD-10-CM | POA: Insufficient documentation

## 2017-06-06 LAB — HEPATIC FUNCTION PANEL
ALBUMIN: 4.5 g/dL (ref 3.5–5.0)
ALK PHOS: 71 U/L (ref 38–126)
ALT: 26 U/L (ref 14–54)
AST: 27 U/L (ref 15–41)
Bilirubin, Direct: <0.1 mg/dL — ABNORMAL LOW (ref 0.1–0.5)
Total Bilirubin: 0.8 mg/dL (ref 0.3–1.2)
Total Protein: 7.7 g/dL (ref 6.5–8.1)

## 2017-06-15 LAB — HCV RT-PCR, QUANT (NON-GRAPH)
HCV Log 10: UNDETERMINED Log10copy/mL
IU LOG10: UNDETERMINED {Log_IU}/mL

## 2018-03-06 ENCOUNTER — Ambulatory Visit
Admission: EM | Admit: 2018-03-06 | Discharge: 2018-03-06 | Disposition: A | Discharge status: Home / Self Care | Payer: Self-pay | Attending: Family Medicine | Admitting: Family Medicine

## 2018-03-06 ENCOUNTER — Other Ambulatory Visit: Payer: Self-pay

## 2018-03-06 DIAGNOSIS — S6992XA Unspecified injury of left wrist, hand and finger(s), initial encounter: Secondary | ICD-10-CM

## 2018-03-06 NOTE — ED Provider Notes (Signed)
[facility]    CSN: [id number] Arrival date & time: 03/06/18  1923     History   Chief Complaint Chief Complaint  Patient presents with  . Finger Injury    HPI Nina Cunningham is a 57 y.o. female.   HPI  57 year old female presents accompanied by her daughter complaining that yesterday she was washing dishes for can her left nondominant hand when she injured her left index finger thinks the fork tine may have over her DIP joint ulnar side.  Noticed today that her finger was numb and turning black.  It has improved since she has gotten here.  No longer has the numbness finger is fully perfused.  No pain.       Past Medical History:  Diagnosis Date  . Centrilobular emphysema (HCC)   . Chronic hepatitis C without hepatic coma (HCC)   . Gastro-esophageal reflux   . Hyperlipidemia     Patient Active Problem List   Diagnosis Date Noted  . Centriacinar emphysema (HCC) 04/15/2015  . HCV (hepatitis C virus) 04/15/2015  . Hypercholesterolemia without hypertriglyceridemia 04/15/2015    Past Surgical History:  Procedure Laterality Date  . CESAREAN SECTION    . VAGINAL HYSTERECTOMY      OB History    No data available       Home Medications    Prior to Admission medications   Medication Sig Start Date End Date Taking? Authorizing Provider  omeprazole (PRILOSEC) 20 MG capsule Take by mouth. 04/15/15   [provider]  varenicline (CHANTIX PAK) 0.5 MG X 11 & 1 MG X 42 tablet Follow package directions. 04/15/15   [provider]    Family History Family History  Problem Relation Age of Onset  . Stroke Father     Social History Social History   Tobacco Use  . Smoking status: Current Every Day Smoker    Packs/day: 1.00    Years: 40.00    Pack years: 40.00  . Smokeless tobacco: Never Used  Substance Use Topics  . Alcohol use: No    Alcohol/week: 0.0 oz  . Drug use: No     Allergies   Patient has no known  allergies.   Review of Systems Review of Systems  Constitutional: Positive for activity change. Negative for appetite change, chills, fatigue and fever.  Skin: Positive for color change.  All other systems reviewed and are negative.    Physical Exam Triage Vital Signs ED Triage Vitals [03/06/18 1936]  Enc Vitals Group     BP (!) 148/79     Pulse Rate 79     Resp 16     Temp 97.8 F (36.6 C)     Temp Source Oral     SpO2 99 %     Weight 130 lb (59 kg)     Height 5\' 7"  (1.702 m)     Head Circumference      Peak Flow      Pain Score      Pain Loc      Pain Edu?      Excl. in GC?    No data found.  Updated Vital Signs BP (!) 148/79 (BP Location: Left Arm)   Pulse 79   Temp 97.8 F (36.6 C) (Oral)   Resp 16   Ht 5\' 7"  (1.702 m)   Wt 130 lb (59 kg)   SpO2 99%   BMI 20.36 kg/m   Visual Acuity Right Eye Distance:  Left Eye Distance:   Bilateral Distance:    Right Eye Near:   Left Eye Near:    Bilateral Near:     Physical Exam  Constitutional: She is oriented to person, place, and time. She appears well-developed and well-nourished. No distress.  HENT:  Head: Normocephalic.  Eyes: Pupils are equal, round, and reactive to light.  Neck: Normal range of motion.  Musculoskeletal: Normal range of motion. She exhibits no edema, tenderness or deformity.  Examination of the left nondominant hand shows no ecchymosis or breaks in the skin.  Patient has less than 2-second capillary refill.  At the radial artery is full.  She has full range of motion of the MP PIP and DIP joints.  He has no Tinel sign over the radial or ulnar digital nerves.  Swelling of the joint.  All tendons are tacked and strong.  Totally normal exam of the finger today.  Neurological: She is alert and oriented to person, place, and time.  Skin: Skin is warm and dry. She is not diaphoretic.  Psychiatric: She has a normal mood and affect. Her behavior is normal. Judgment and thought content normal.   Nursing note and vitals reviewed.    UC Treatments / Results  Labs (all labs ordered are listed, but only abnormal results are displayed) Labs Reviewed - No data to display  EKG  EKG Interpretation None       Radiology No results found.  Procedures Procedures (including critical care time)  Medications Ordered in UC Medications - No data to display   Initial Impression / Assessment and Plan / UC Course  I have reviewed the triage vital signs and the nursing notes.  Pertinent labs & imaging results that were available during my care of the patient were reviewed by me and considered in my medical decision making (see chart for details).     Plan: 1. Test/x-ray results and diagnosis reviewed with patient 2. rx as per orders; risks, benefits, potential side effects reviewed with patient 3. Recommend supportive treatment with observation.  If it recurs she should return to clinic or go to a hand surgeon for further evaluation.  Reassured that there is no positive findings on her exam today. 4. F/u prn if symptoms worsen or don't improve   Final Clinical Impressions(s) / UC Diagnoses   Final diagnoses:  Injury of finger of left hand, initial encounter    ED Discharge Orders    None       Controlled Substance Prescriptions [place] Controlled Substance Registry consulted? Not Applicable   Lutricia FeilRoemer, Kaeson Kleinert P, PA-C 03/06/18 2017

## 2018-03-06 NOTE — Discharge Instructions (Signed)
Keep observing your finger and if it does recur, you can return to our clinic or follow-up with a hand surgeon.  The present time your exam is entirely normal and hopefully this will not bother you any longer or return.

## 2018-03-06 NOTE — ED Triage Notes (Signed)
Pt states she was washing dishes yesterday, and injured her left index finger. She states her finger is numb and "turining black".

## 2018-09-17 ENCOUNTER — Other Ambulatory Visit: Payer: Self-pay

## 2018-09-17 ENCOUNTER — Ambulatory Visit
Admission: EM | Admit: 2018-09-17 | Discharge: 2018-09-17 | Disposition: A | Discharge status: Home / Self Care | Payer: Self-pay | Attending: Internal Medicine | Admitting: Internal Medicine

## 2018-09-17 DIAGNOSIS — B354 Tinea corporis: Secondary | ICD-10-CM

## 2018-09-17 DIAGNOSIS — R350 Frequency of micturition: Secondary | ICD-10-CM

## 2018-09-17 DIAGNOSIS — R3915 Urgency of urination: Secondary | ICD-10-CM

## 2018-09-17 LAB — URINALYSIS, COMPLETE (UACMP) WITH MICROSCOPIC
BILIRUBIN URINE: NEGATIVE
Bacteria, UA: NONE SEEN
GLUCOSE, UA: NEGATIVE mg/dL
Hgb urine dipstick: NEGATIVE
Ketones, ur: NEGATIVE mg/dL
LEUKOCYTES UA: NEGATIVE
Nitrite: NEGATIVE
Protein, ur: NEGATIVE mg/dL
RBC / HPF: NONE SEEN RBC/hpf (ref 0–5)
Specific Gravity, Urine: 1.01 (ref 1.005–1.030)
pH: 5.5 (ref 5.0–8.0)

## 2018-09-17 MED ORDER — CLOTRIMAZOLE 1 % EX CREA: TOPICAL_CREAM | CUTANEOUS | 1 refills | Status: AC

## 2018-09-17 NOTE — Discharge Instructions (Addendum)
RASH/TINEA INFECTION: Your rash is consistent with a fungal infection. Begin antifungal cream. Keep area clean and dry. May take benadryl as needed for itching.   INCREASED URINARY FREQUENCY AND URGENCY: Your urinalysis is normal today. We will send a culture to ensure there is no urinary infection. Given your symptoms and normal Ua, along with the fact that you have had a hysterectomy, there is likely an anatomical reason for your symptoms. Sometimes the bladder can fall and create the feelings of urgency and frequency. Recommend following up with your PCP in the next week or so for evaluation and possible referral to urologist.

## 2018-09-17 NOTE — ED Triage Notes (Addendum)
Patient complains of ringworm like rash at the top of buttock cheeks. Patient states that this has been ongoing for 3 months. Patient reports that area is itchy and painful.   Patient complains of urinary frequency and urgency, feels like she is not completely emptying her bladder x 6 months.

## 2018-09-17 NOTE — ED Provider Notes (Signed)
[facility]    CSN: [id number] Arrival date & time: 09/17/18  1023     History   Chief Complaint Chief Complaint  Patient presents with  . Rash  . Urinary Frequency    HPI Nina Cunningham is a 57 y.o. female. Patient presents today with two complaints. First, she states that she has had a rash that looks like ringworm over her tailbone x 2-3 months. The area itches and is spreading. She denies pain. She has not taken or used OTC medications.   Additionally, she complains of increased urinary frequency and urgency x 6 months or more. She denies painful urination, blood in urine, abdominal pain, back pain, pelvic pain, vaginal discharge. Patient has history of hysterectomy. Denies being sexually active. She states this is not getting better or worse. She has no further complaints or concerns today.   HPI  Past Medical History:  Diagnosis Date  . Centrilobular emphysema (HCC)   . Chronic hepatitis C without hepatic coma (HCC)   . Gastro-esophageal reflux   . Hyperlipidemia     Patient Active Problem List   Diagnosis Date Noted  . Centriacinar emphysema (HCC) 04/15/2015  . HCV (hepatitis C virus) 04/15/2015  . Hypercholesterolemia without hypertriglyceridemia 04/15/2015    Past Surgical History:  Procedure Laterality Date  . CESAREAN SECTION    . VAGINAL HYSTERECTOMY      OB History   None      Home Medications    Prior to Admission medications   Medication Sig Start Date End Date Taking? Authorizing Provider  clotrimazole (LOTRIMIN) 1 % cream Apply to affected area 2 times daily 09/17/18 10/01/18  Shirlee Latch, PA-C  omeprazole (PRILOSEC) 20 MG capsule Take by mouth. 04/15/15   [provider]  varenicline (CHANTIX PAK) 0.5 MG X 11 & 1 MG X 42 tablet Follow package directions. 04/15/15   [provider]    Family History Family History  Problem Relation Age of Onset  . Stroke Father     Social History Social History    Tobacco Use  . Smoking status: Current Every Day Smoker    Packs/day: 1.00    Years: 40.00    Pack years: 40.00  . Smokeless tobacco: Never Used  Substance Use Topics  . Alcohol use: No    Alcohol/week: 0.0 standard drinks  . Drug use: No     Allergies   Patient has no known allergies.   Review of Systems Review of Systems  Constitutional: Negative for activity change, fatigue and fever.  Gastrointestinal: Negative for abdominal distention, abdominal pain, constipation, diarrhea, nausea and vomiting.  Genitourinary: Positive for frequency and urgency. Negative for decreased urine volume, difficulty urinating, dyspareunia, dysuria, enuresis, flank pain, genital sores, hematuria, pelvic pain, vaginal bleeding, vaginal discharge and vaginal pain.  Musculoskeletal: Negative for arthralgias, back pain and myalgias.  Skin: Positive for rash. Negative for color change, pallor and wound.  Neurological: Negative for weakness.  Hematological: Negative for adenopathy.     Physical Exam Triage Vital Signs ED Triage Vitals  Enc Vitals Group     BP 09/17/18 1045 (!) 133/91     Pulse Rate 09/17/18 1045 81     Resp 09/17/18 1045 18     Temp 09/17/18 1045 98.2 F (36.8 C)     Temp Source 09/17/18 1045 Oral     SpO2 09/17/18 1045 99 %     Weight 09/17/18 1044 135 lb (61.2 kg)     Height 09/17/18  1044 5\' 7"  (1.702 m)     Head Circumference --      Peak Flow --      Pain Score 09/17/18 1043 4     Pain Loc --      Pain Edu? --      Excl. in GC? --    No data found.  Updated Vital Signs BP (!) 133/91 (BP Location: Left Arm)   Pulse 81   Temp 98.2 F (36.8 C) (Oral)   Resp 18   Ht 5\' 7"  (1.702 m)   Wt 135 lb (61.2 kg)   SpO2 99%   BMI 21.14 kg/m      Physical Exam  Constitutional: She is oriented to person, place, and time. She appears well-developed and well-nourished.  HENT:  Head: Normocephalic and atraumatic.  Eyes: Conjunctivae and EOM are normal. No scleral  icterus.  Cardiovascular: Normal rate, regular rhythm and normal heart sounds.  No murmur heard. Pulmonary/Chest: Effort normal and breath sounds normal. No respiratory distress. She has no wheezes.  Abdominal: Soft. There is no tenderness.  Neurological: She is alert and oriented to person, place, and time.  Skin: Skin is warm and dry. Rash (mildly erythematous patch with central clearing skin over SI joint, no tenderness, no fluctuance, no drainage) noted.  Psychiatric: She has a normal mood and affect. Her behavior is normal.  Nursing note and vitals reviewed.    UC Treatments / Results  Labs (all labs ordered are listed, but only abnormal results are displayed) Labs Reviewed  URINALYSIS, COMPLETE (UACMP) WITH MICROSCOPIC - Abnormal; Notable for the following components:      Result Value   Non Squamous Epithelial PRESENT (*)    All other components within normal limits    EKG None  Radiology No results found.  Procedures Procedures (including critical care time)  Medications Ordered in UC Medications - No data to display  Initial Impression / Assessment and Plan / UC Course  I have reviewed the triage vital signs and the nursing notes.  Pertinent labs & imaging results that were available during my care of the patient were reviewed by me and considered in my medical decision making (see chart for details).   Patient's rash consistent with tinea infection. Discussed use of topical antifungal. See below.  Patient with urinary urgency and frequency and a normal UA. Discussed other diagnoses that can cause such symptoms such as prolapsed bladder, dysfunctional bladder, etc. Advised f/u with PCP and possible urology referral for further work up.   Final Clinical Impressions(s) / UC Diagnoses   Final diagnoses:  Tinea corporis  Urinary frequency  Urinary urgency     Discharge Instructions     RASH/TINEA INFECTION: Your rash is consistent with a fungal infection.  Begin antifungal cream. Keep area clean and dry. May take benadryl as needed for itching.   INCREASED URINARY FREQUENCY AND URGENCY: Your urinalysis is normal today. We will send a culture to ensure there is no urinary infection. Given your symptoms and normal Ua, along with the fact that you have had a hysterectomy, there is likely an anatomical reason for your symptoms. Sometimes the bladder can fall and create the feelings of urgency and frequency. Recommend following up with your PCP in the next week or so for evaluation and possible referral to urologist.     ED Prescriptions    Medication Sig Dispense Auth. Provider   clotrimazole (LOTRIMIN) 1 % cream Apply to affected area 2 times daily 15 g  Shirlee LatchEaves, Marina Boerner B, PA-C     Controlled Substance Prescriptions [place] Controlled Substance Registry consulted? Not Applicable   Gareth Morganaves, Mohan Erven B, PA-C 09/19/18 1740

## 2018-11-19 ENCOUNTER — Emergency Department
Admission: EM | Admit: 2018-11-19 | Discharge: 2018-11-19 | Disposition: A | Discharge status: Home / Self Care | Payer: Self-pay | Attending: Emergency Medicine | Admitting: Emergency Medicine

## 2018-11-19 ENCOUNTER — Other Ambulatory Visit: Payer: Self-pay

## 2018-11-19 ENCOUNTER — Encounter: Payer: Self-pay | Admitting: Emergency Medicine

## 2018-11-19 DIAGNOSIS — F17200 Nicotine dependence, unspecified, uncomplicated: Secondary | ICD-10-CM | POA: Insufficient documentation

## 2018-11-19 DIAGNOSIS — B354 Tinea corporis: Secondary | ICD-10-CM | POA: Insufficient documentation

## 2018-11-19 DIAGNOSIS — Z79899 Other long term (current) drug therapy: Secondary | ICD-10-CM | POA: Insufficient documentation

## 2018-11-19 MED ORDER — KETOCONAZOLE 2 % EX CREA: 1.0000 "application " | TOPICAL_CREAM | Freq: Two times a day (BID) | CUTANEOUS | 0 refills | Status: AC

## 2018-11-19 MED ORDER — TERBINAFINE HCL 250 MG PO TABS: 250.0000 mg | ORAL_TABLET | Freq: Every day | ORAL | 0 refills | Status: AC

## 2018-11-19 NOTE — ED Provider Notes (Signed)
[facility] EMERGENCY DEPARTMENT Provider Note   CSN: [id number] Arrival date & time: 11/19/18  2035     History   Chief Complaint Chief Complaint  Patient presents with  . Rash    HPI Nina TURBERVILLE is a 57 y.o. female presents to the emergency department for evaluation of rash.  She is had a circular rash to the lower back along the superior gluteal cleft x3 months with mild itching and pain.  She was seen a while back and treated with topical Lamisil with no improvement.  She denies any fevers, swelling, back pain, numbness tingling radicular symptoms.  She is ambulatory no assistive devices.  She has an appointment with dermatologist in January.  HPI  Past Medical History:  Diagnosis Date  . Centrilobular emphysema (HCC)   . Chronic hepatitis C without hepatic coma (HCC)   . Gastro-esophageal reflux   . Hyperlipidemia     Patient Active Problem List   Diagnosis Date Noted  . Centriacinar emphysema (HCC) 04/15/2015  . HCV (hepatitis C virus) 04/15/2015  . Hypercholesterolemia without hypertriglyceridemia 04/15/2015    Past Surgical History:  Procedure Laterality Date  . CESAREAN SECTION    . VAGINAL HYSTERECTOMY       OB History   None      Home Medications    Prior to Admission medications   Medication Sig Start Date End Date Taking? Authorizing Provider  ketoconazole (NIZORAL) 2 % cream Apply 1 application topically 2 (two) times daily. 11/19/18   Evon Slack, PA-C  omeprazole (PRILOSEC) 20 MG capsule Take by mouth. 04/15/15   [provider]  terbinafine (LAMISIL) 250 MG tablet Take 1 tablet (250 mg total) by mouth daily for 28 days. 11/19/18 12/17/18  Evon Slack, PA-C  varenicline (CHANTIX PAK) 0.5 MG X 11 & 1 MG X 42 tablet Follow package directions. 04/15/15   [provider]    Family History Family History  Problem Relation Age of Onset  . Stroke Father     Social History Social History    Tobacco Use  . Smoking status: Current Every Day Smoker    Packs/day: 1.00    Years: 40.00    Pack years: 40.00  . Smokeless tobacco: Never Used  Substance Use Topics  . Alcohol use: No    Alcohol/week: 0.0 standard drinks  . Drug use: No     Allergies   Patient has no known allergies.   Review of Systems Review of Systems  Constitutional: Negative for fever.  Musculoskeletal: Negative for back pain, gait problem, joint swelling and myalgias.  Skin: Positive for rash. Negative for wound.  Neurological: Negative for weakness, numbness and headaches.     Physical Exam Updated Vital Signs BP (!) 145/86 (BP Location: Left Arm)   Pulse 76   Temp 97.7 F (36.5 C) (Oral)   Resp 18   Ht 5\' 7"  (1.702 m)   Wt 62.6 kg   SpO2 98%   BMI 21.61 kg/m   Physical Exam  Constitutional: She is oriented to person, place, and time. She appears well-developed and well-nourished.  HENT:  Head: Normocephalic and atraumatic.  Eyes: Conjunctivae are normal.  Neck: Normal range of motion.  Cardiovascular: Normal rate.  Pulmonary/Chest: Effort normal. No respiratory distress.  Musculoskeletal: Normal range of motion.  Neurological: She is alert and oriented to person, place, and time.  Skin: Skin is warm. Rash noted.  Well-demarcated annular scaly rash on the lower part of the  lumbar spine just above the superior gluteal cleft.  Rash is slightly erythematous minimally tender to palpation.  Psychiatric: She has a normal mood and affect. Her behavior is normal. Thought content normal.     ED Treatments / Results  Labs (all labs ordered are listed, but only abnormal results are displayed) Labs Reviewed - No data to display  EKG None  Radiology No results found.  Procedures Procedures (including critical care time)  Medications Ordered in ED Medications - No data to display   Initial Impression / Assessment and Plan / ED Course  I have reviewed the triage vital signs and  the nursing notes.  Pertinent labs & imaging results that were available during my care of the patient were reviewed by me and considered in my medical decision making (see chart for details).     57 year old female with annular erythematous scaly pruritic rash consistent with possible tinea corporis or fungal folliculitis.  Will place patient on oral low-dose antifungal terbinafine as well as topical antifungal, ketoconazole.  Patient will call PCP to schedule follow-up appointment.  Patient understands signs symptoms return to ED for.  Final Clinical Impressions(s) / ED Diagnoses   Final diagnoses:  Tinea corporis    ED Discharge Orders         Ordered    terbinafine (LAMISIL) 250 MG tablet  Daily     11/19/18 2156    ketoconazole (NIZORAL) 2 % cream  2 times daily     11/19/18 2156           Ronnette JuniperGaines,  C, PA-C 11/19/18 2212    Myrna BlazerSchaevitz, David Matthew, MD 11/19/18 [phone]

## 2018-11-19 NOTE — ED Triage Notes (Addendum)
Pt presents to ED with itchy painful area on her lower back for several months. Circular area with reddened edges noted. Painful to touch. Has been seen by urgent care and the health dept for the same with no improvement. area has grown from the size a quarter to the size of a golf ball since onset.

## 2018-11-19 NOTE — Discharge Instructions (Addendum)
Please take medications as prescribed.  Apply antifungal cream to the rash twice daily.  Follow-up with dermatologist or PCP if no improvement

## 2018-11-19 NOTE — ED Notes (Signed)
Rash to midline lower back

## 2019-09-09 ENCOUNTER — Telehealth: Payer: Self-pay | Admitting: Gastroenterology

## 2019-09-09 NOTE — Telephone Encounter (Signed)
Pt l/m wanting to schedule a colonoscopy pt has been seen since 2017 does she need new referral for colonoscopy?

## 2019-09-10 NOTE — Telephone Encounter (Signed)
Pt advised to contact her PCP to request a referral to be sent.

## 2024-07-29 ENCOUNTER — Ambulatory Visit

## 2024-07-29 ENCOUNTER — Encounter: Payer: Self-pay | Admitting: Podiatry

## 2024-07-29 ENCOUNTER — Ambulatory Visit (INDEPENDENT_AMBULATORY_CARE_PROVIDER_SITE_OTHER): Admitting: Podiatry

## 2024-07-29 DIAGNOSIS — L608 Other nail disorders: Secondary | ICD-10-CM

## 2024-07-29 DIAGNOSIS — B351 Tinea unguium: Secondary | ICD-10-CM

## 2024-07-29 DIAGNOSIS — M79676 Pain in unspecified toe(s): Secondary | ICD-10-CM

## 2024-07-29 NOTE — Progress Notes (Signed)
  Subjective:  Patient ID: Nina Cunningham, female    DOB: [birth year],   MRN: [id number]  Chief Complaint  Patient presents with   Nail Problem    My toenails are growing inward, mainly on the right foot but starting on the left too.    63 y.o. female presents for concern of toenails growing in mostly on the right foot but some on the left foot as well too. Relates they are very painful. Requesting to have them trimmed . Denies any other pedal complaints. Denies n/v/f/c.   Past Medical History:  Diagnosis Date   Centrilobular emphysema (HCC)    Chronic hepatitis C without hepatic coma (HCC)    Gastro-esophageal reflux    Hyperlipidemia     Objective:  Physical Exam: Vascular: DP/PT pulses 2/4 bilateral. CFT <3 seconds. Normal hair growth on digits. No edema.  Skin. No lacerations or abrasions bilateral feet. Nails 1-5 bilateral are incurvated bilateral with pincer type deformity. Elongated and painful to touch.  Musculoskeletal: MMT 5/5 bilateral lower extremities in DF, PF, Inversion and Eversion. Deceased ROM in DF of ankle joint.  Neurological: Sensation intact to light touch.   Assessment:   1. Pain due to onychomycosis of toenail   2. Pincer nail deformity      Plan:  Patient was evaluated and treated and all questions answered. -Discussed pincer type nails and treatments options  -Discussed removal of nails and ingrown nail procedures in future if needed.  -Mechanically debrided all nails 1-5 bilateral using sterile nail nipper and filed with dremel without incident as courtesy today  -Answered all patient questions -Patient to return as needed   Asberry Failing, DPM
